# Patient Record
Sex: Male | Born: 2000 | Race: Black or African American | Hispanic: No | Marital: Single | Smoking: Never smoker
Health system: Southern US, Community
[De-identification: ages and names within clinical notes are randomized; demographics above are authoritative.]

---
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
## 2020-09-06 DIAGNOSIS — K029 Dental caries, unspecified: Secondary | ICD-10-CM | POA: Diagnosis not present

## 2020-09-15 ENCOUNTER — Encounter (HOSPITAL_COMMUNITY): Payer: Self-pay | Admitting: Emergency Medicine

## 2020-09-15 ENCOUNTER — Other Ambulatory Visit: Payer: Self-pay

## 2020-09-15 ENCOUNTER — Emergency Department (HOSPITAL_COMMUNITY)
Admission: EM | Admit: 2020-09-15 | Discharge: 2020-09-15 | Disposition: A | Discharge status: Home / Self Care | Payer: Medicaid Other | Attending: Emergency Medicine | Admitting: Emergency Medicine

## 2020-09-15 DIAGNOSIS — U071 COVID-19: Secondary | ICD-10-CM | POA: Insufficient documentation

## 2020-09-15 DIAGNOSIS — M791 Myalgia, unspecified site: Secondary | ICD-10-CM | POA: Diagnosis present

## 2020-09-15 LAB — RESP PANEL BY RT-PCR (FLU A&B, COVID) ARPGX2
Influenza A by PCR: NEGATIVE
Influenza B by PCR: NEGATIVE
SARS Coronavirus 2 by RT PCR: POSITIVE — AB

## 2020-09-15 MED ORDER — ACETAMINOPHEN 325 MG PO TABS
650.0000 mg | ORAL_TABLET | Freq: Once | ORAL | Status: AC | PRN
  Administered 2020-09-15: 650 mg via ORAL
  Filled 2020-09-15: qty 2

## 2020-09-15 NOTE — Discharge Instructions (Addendum)
Covid test today is positive.  Alternate Tylenol and ibuprofen every 4-6 hours for fever and/or body aches.  You may use over-the-counter cough medication such as Robitussin if needed for cough.  It is important that you isolate at home for at least 5 days.  On day 5, if you are feeling better and no longer having a fever for at least 24 hours without taking Tylenol or ibuprofen then you may return to normal activity but must wear a mask around others for at least 5 additional days.  If on day 5 you are still having symptoms, you will need to continue to quarantine for the full 10 days.  Follow-up with your primary doctor for recheck, return to the ER for any worsening symptoms such as shortness of breath.

## 2020-09-15 NOTE — ED Provider Notes (Signed)
[facility] EMERGENCY DEPARTMENT Provider Note   CSN: [id number] Arrival date & time: 09/15/20  1453     History Chief Complaint  Patient presents with  . Abdominal Pain    Rodney Burke is a 20 y.o. male.  HPI     Rodney Burke is a 20 y.o. male who presents to the Emergency Department complaining of generalized body aches, fever and chills with sweats this morning upon waking.  He also endorses having one episode of vomiting this morning and diarrhea.  All his symptoms began this morning.  He did not check his temperature at home this morning.  He is not taking any medications for symptomatic relief.  Possible Covid exposure at work, but he is unsure.  He denies chest pain, shortness of breath neck pain or stiffness, abdominal pain.  No dysuria.  He is here requesting to be tested for Covid.  He is unvaccinated.    History reviewed. No pertinent past medical history.  There are no problems to display for this patient.   History reviewed. No pertinent surgical history.     Family History  Family history unknown: Yes    Social History   Tobacco Use  . Smoking status: Never Smoker  . Smokeless tobacco: Never Used  Vaping Use  . Vaping Use: Never used  Substance Use Topics  . Alcohol use: No  . Drug use: Yes    Frequency: 1.0 times per week    Types: Marijuana    Home Medications Prior to Admission medications   Not on File     Allergies    Patient has no known allergies.  Review of Systems   Review of Systems  Constitutional: Negative for chills, fatigue and fever.  HENT: Negative for sore throat and trouble swallowing.   Respiratory: Negative for cough, shortness of breath and wheezing.   Cardiovascular: Negative for chest pain and palpitations.  Gastrointestinal: Negative for abdominal pain, blood in stool, nausea and vomiting.  Genitourinary: Negative for dysuria, flank pain and hematuria.  Musculoskeletal: Negative for arthralgias, back  pain, myalgias, neck pain and neck stiffness.  Skin: Negative for rash.  Neurological: Negative for dizziness, weakness and numbness.  Hematological: Does not bruise/bleed easily.    Physical Exam Updated Vital Signs BP (!) 144/129 (BP Location: Right Arm)   Pulse 96   Temp (!) 102.2 F (39 C) (Oral)   Resp 18   Ht 6' (1.829 m)   Wt 72.6 kg   SpO2 98%   BMI 21.70 kg/m   Physical Exam Vitals and nursing note reviewed.  Constitutional:      General: He is not in acute distress.    Appearance: Normal appearance. He is well-developed and well-nourished.  HENT:     Head: Normocephalic.     Mouth/Throat:     Mouth: Oropharynx is clear and moist.  Cardiovascular:     Rate and Rhythm: Normal rate and regular rhythm.     Pulses: Normal pulses and intact distal pulses.  Pulmonary:     Effort: Pulmonary effort is normal. No respiratory distress.     Breath sounds: Normal breath sounds.  Abdominal:     Palpations: Abdomen is soft. There is no mass.     Tenderness: There is no abdominal tenderness. There is no guarding or rebound.  Musculoskeletal:        General: No tenderness or edema. Normal range of motion.  Skin:    General: Skin is warm.  Capillary Refill: Capillary refill takes less than 2 seconds.     Findings: No rash.  Neurological:     General: No focal deficit present.     Mental Status: He is alert.     Sensory: No sensory deficit.     Motor: No weakness or abnormal muscle tone.     Coordination: Coordination normal.     ED Results / Procedures / Treatments   Labs (all labs ordered are listed, but only abnormal results are displayed) Labs Reviewed  RESP PANEL BY RT-PCR (FLU A&B, COVID) ARPGX2 - Abnormal; Notable for the following components:      Result Value   SARS Coronavirus 2 by RT PCR POSITIVE (*)    All other components within normal limits    EKG None  Radiology No results found.  Procedures Procedures (including critical care  time)  Medications Ordered in ED Medications  acetaminophen (TYLENOL) tablet 650 mg (650 mg Oral Given 09/15/20 1512)    ED Course  I have reviewed the triage vital signs and the nursing notes.  Pertinent labs & imaging results that were available during my care of the patient were reviewed by me and considered in my medical decision making (see chart for details).    MDM Rules/Calculators/A&P                          Patient here with symptoms concerning for Covid infection.  He is well-appearing, nontoxic.  Onset of symptoms this morning.  No known Covid exposures.  He is unvaccinated.  On exam, he is well-appearing, febrile.  Given Tylenol here.  No hypoxia or tachycardia.  Nontoxic-appearing.  Patient appears appropriate for discharge home, given CDC guideline instructions for isolation.  He agrees to symptomatic treatment with over-the-counter fever reducers, return precautions also given.  Rodney Burke was evaluated in Emergency Department on 09/15/2020 for the symptoms described in the history of present illness. He was evaluated in the context of the global COVID-19 pandemic, which necessitated consideration that the patient might be at risk for infection with the SARS-CoV-2 virus that causes COVID-19. Institutional protocols and algorithms that pertain to the evaluation of patients at risk for COVID-19 are in a state of rapid change based on information released by regulatory bodies including the CDC and federal and state organizations. These policies and algorithms were followed during the patient's care in the ED.  Final Clinical Impression(s) / ED Diagnoses Final diagnoses:  COVID-19 virus infection    Rx / DC Orders ED Discharge Orders    None       Pauline Aus, PA-C 09/15/20 1704    Cathren Laine, MD 09/15/20 [phone]

## 2020-09-15 NOTE — ED Notes (Signed)
CRITICAL VALUE ALERT  Critical Value:  Covid Positive  Date & Time Notied:  09/15/20 1637  Provider Notified: Pauline Aus, PA  Orders Received/Actions taken: EDP notified

## 2020-09-15 NOTE — ED Triage Notes (Signed)
Pt c/o N/V/D that began this morning. Pt has a fever of 102.2 in triage.

## 2020-09-16 ENCOUNTER — Telehealth: Payer: Self-pay

## 2020-09-16 NOTE — Telephone Encounter (Signed)
Transition Care Management Follow-up Telephone Call  Date of discharge and from where: 09/15/2020 [facility]  How have you been since you were released from the hospital? Feeling good, fever is coming down.   Any questions or concerns? No  Items Reviewed:  Did the pt receive and understand the discharge instructions provided? Yes   Medications obtained and verified? No medications given.   Other? No   Any new allergies since your discharge? No   Dietary orders reviewed? Yes  Do you have support at home? Yes   Functional Questionnaire: (I = Independent and D = Dependent) ADLs: I  Bathing/Dressing- I  Meal Prep- I  Eating- I  Maintaining continence- I  Transferring/Ambulation- I  Managing Meds- I  Follow up appointments reviewed:   PCP Hospital f/u appt confirmed? No  Stated he will need to speak to his mother about setting up appointment with a primary care.   Specialist Hospital f/u appt confirmed? No    Are transportation arrangements needed? No   If their condition worsens, is the pt aware to call PCP or go to the Emergency Dept.? Yes  Was the patient provided with contact information for the PCP's office or ED? Yes  Was to pt encouraged to call back with questions or concerns? Yes

## 2020-10-18 DIAGNOSIS — M542 Cervicalgia: Secondary | ICD-10-CM | POA: Diagnosis not present

## 2020-10-18 DIAGNOSIS — Z041 Encounter for examination and observation following transport accident: Secondary | ICD-10-CM | POA: Diagnosis not present

## 2020-12-09 DIAGNOSIS — K029 Dental caries, unspecified: Secondary | ICD-10-CM | POA: Diagnosis not present

## 2020-12-09 DIAGNOSIS — K0889 Other specified disorders of teeth and supporting structures: Secondary | ICD-10-CM | POA: Diagnosis not present

## 2020-12-10 ENCOUNTER — Telehealth: Payer: Self-pay

## 2020-12-10 NOTE — Telephone Encounter (Signed)
Transition Care Management Unsuccessful Follow-up Telephone Call  Date of discharge and from where:  12/10/2020 from [facility]  Attempts:  1st Attempt  Reason for unsuccessful TCM follow-up call:  Unable to leave message

## 2020-12-13 NOTE — Telephone Encounter (Signed)
Transition Care Management Unsuccessful Follow-up Telephone Call  Date of discharge and from where:  12/10/2020 from [facility]  Attempts:  2nd Attempt  Reason for unsuccessful TCM follow-up call:  Unable to leave message

## 2020-12-14 NOTE — Telephone Encounter (Signed)
Transition Care Management Unsuccessful Follow-up Telephone Call  Date of discharge and from where:  12/10/2020 from [facility]  Attempts:  3rd Attempt  Reason for unsuccessful TCM follow-up call:  Unable to reach patient

## 2021-06-04 ENCOUNTER — Other Ambulatory Visit: Payer: Self-pay

## 2021-06-04 ENCOUNTER — Emergency Department (HOSPITAL_COMMUNITY)
Admission: EM | Admit: 2021-06-04 | Discharge: 2021-06-04 | Discharge status: Against Medical Advice | Payer: Medicaid Other

## 2021-06-07 DIAGNOSIS — S63501A Unspecified sprain of right wrist, initial encounter: Secondary | ICD-10-CM | POA: Diagnosis not present

## 2021-06-07 DIAGNOSIS — S60211A Contusion of right wrist, initial encounter: Secondary | ICD-10-CM | POA: Diagnosis not present

## 2022-05-22 DIAGNOSIS — H6502 Acute serous otitis media, left ear: Secondary | ICD-10-CM | POA: Diagnosis not present

## 2022-05-22 DIAGNOSIS — H6692 Otitis media, unspecified, left ear: Secondary | ICD-10-CM | POA: Diagnosis not present

## 2022-05-24 DIAGNOSIS — J019 Acute sinusitis, unspecified: Secondary | ICD-10-CM | POA: Diagnosis not present

## 2023-02-12 DIAGNOSIS — K029 Dental caries, unspecified: Secondary | ICD-10-CM | POA: Diagnosis not present

## 2023-02-12 DIAGNOSIS — K0889 Other specified disorders of teeth and supporting structures: Secondary | ICD-10-CM | POA: Diagnosis not present

## 2024-05-15 DIAGNOSIS — Z202 Contact with and (suspected) exposure to infections with a predominantly sexual mode of transmission: Secondary | ICD-10-CM | POA: Diagnosis not present
